# Patient Record
Sex: Female | Born: 1965 | Race: Black or African American | Hispanic: No | Marital: Single | Smoking: Current every day smoker
Health system: Southern US, Community
[De-identification: ages and names within clinical notes are randomized; demographics above are authoritative.]

## PROBLEM LIST (undated history)

## (undated) DIAGNOSIS — Z87440 Personal history of urinary (tract) infections: Secondary | ICD-10-CM

## (undated) HISTORY — PX: OTHER SURGICAL HISTORY: SHX169

## (undated) HISTORY — DX: Personal history of urinary (tract) infections: Z87.440

---

## 2017-10-06 ENCOUNTER — Other Ambulatory Visit: Payer: Self-pay | Admitting: Geriatric Medicine

## 2017-10-06 DIAGNOSIS — Z1231 Encounter for screening mammogram for malignant neoplasm of breast: Secondary | ICD-10-CM

## 2017-11-04 ENCOUNTER — Ambulatory Visit: Payer: Self-pay

## 2018-08-11 DIAGNOSIS — R7303 Prediabetes: Secondary | ICD-10-CM | POA: Insufficient documentation

## 2018-12-14 ENCOUNTER — Encounter: Payer: Self-pay | Admitting: Advanced Practice Midwife

## 2018-12-14 ENCOUNTER — Other Ambulatory Visit: Payer: Self-pay

## 2018-12-14 ENCOUNTER — Ambulatory Visit (LOCAL_COMMUNITY_HEALTH_CENTER): Payer: BC Managed Care – PPO | Admitting: Advanced Practice Midwife

## 2018-12-14 VITALS — BP 92/53 | Ht 65.0 in | Wt 122.0 lb

## 2018-12-14 DIAGNOSIS — Z30013 Encounter for initial prescription of injectable contraceptive: Secondary | ICD-10-CM | POA: Diagnosis not present

## 2018-12-14 DIAGNOSIS — R7303 Prediabetes: Secondary | ICD-10-CM

## 2018-12-14 DIAGNOSIS — Z3009 Encounter for other general counseling and advice on contraception: Secondary | ICD-10-CM

## 2018-12-14 DIAGNOSIS — Z3042 Encounter for surveillance of injectable contraceptive: Secondary | ICD-10-CM

## 2018-12-14 LAB — PREGNANCY, URINE: Preg Test, Ur: NEGATIVE

## 2018-12-14 MED ORDER — MEDROXYPROGESTERONE ACETATE 150 MG/ML IM SUSP
150.0000 mg | Freq: Once | INTRAMUSCULAR | Status: AC
  Administered 2018-12-14: 16:00:00 150 mg via INTRAMUSCULAR

## 2018-12-14 NOTE — Progress Notes (Signed)
UPT negative today. Pt received Depo 150mg IM today per provider order and pt tolerated well. Counseled pt per provider orders and pt states understanding. Provider orders completed.Treyten Monestime, RN 

## 2018-12-14 NOTE — Progress Notes (Signed)
   [place] problem visit  [place] Department  Subjective:  Katie Ramos is a 53 y.o. being seen today for DMPA reinitiation  Chief Complaint  Patient presents with  . Contraception    Depo    HPI  Last DMPA 08/11/2018 (17 6/7). Last sex 12/03/18 with condom.  LMP 11/19/18.  No documented PE at [facility]. Does the patient have a current or past history of drug use? No   No components found for: HCV]   Health Maintenance Due  Topic Date Due  . HIV Screening  11/30/1981  . TETANUS/TDAP  11/30/1985  . PAP SMEAR-Modifier  12/01/1987  . MAMMOGRAM  11/30/2016  . COLONOSCOPY  11/30/2016  . INFLUENZA VACCINE  09/30/2018    ROS  The following portions of the patient's history were reviewed and updated as appropriate: allergies, current medications, past family history, past medical history, past social history, past surgical history and problem list. Problem list updated.   See flowsheet for other program required questions.  Objective:   Vitals:   12/14/18 1414  BP: (!) 92/53  Weight: 122 lb (55.3 kg)  Height: 5\' 5"  (1.651 m)    Physical Exam  Appears emaciated  Assessment and Plan:  Katie Ramos is a 53 y.o. female presenting to the [facility] for a Women's Health problem visit  1. Family planning Late for DMPA  2. Encounter for surveillance of injectable contraceptive If PT neg today may have DMPA 150 mg IM x1 Needs physical Please counsel on need for abstinance/back up condoms next 7 days - Pregnancy, urine     Return for 11-13 wk DMPA.  No future appointments.  Herbie Saxon, CNM

## 2018-12-14 NOTE — Progress Notes (Signed)
Pt here for Depo restart. Last Depo was 08/11/2018, so pt is 17 weeks and 6 days post last Depo.Ronny Bacon, RN

## 2019-03-26 ENCOUNTER — Encounter: Payer: Self-pay | Admitting: Emergency Medicine

## 2019-03-26 ENCOUNTER — Ambulatory Visit
Admission: EM | Admit: 2019-03-26 | Discharge: 2019-03-26 | Disposition: A | Discharge status: Home / Self Care | Payer: BC Managed Care – PPO

## 2019-03-26 ENCOUNTER — Other Ambulatory Visit: Payer: Self-pay

## 2019-03-26 DIAGNOSIS — R1030 Lower abdominal pain, unspecified: Secondary | ICD-10-CM | POA: Diagnosis not present

## 2019-03-26 DIAGNOSIS — R3129 Other microscopic hematuria: Secondary | ICD-10-CM | POA: Diagnosis not present

## 2019-03-26 DIAGNOSIS — R197 Diarrhea, unspecified: Secondary | ICD-10-CM | POA: Diagnosis not present

## 2019-03-26 LAB — POCT URINALYSIS DIP (MANUAL ENTRY)
Bilirubin, UA: NEGATIVE
Glucose, UA: NEGATIVE mg/dL
Ketones, POC UA: NEGATIVE mg/dL
Leukocytes, UA: NEGATIVE
Nitrite, UA: NEGATIVE
Protein Ur, POC: NEGATIVE mg/dL
Spec Grav, UA: >=1.03 — AB (ref 1.010–1.025)
Urobilinogen, UA: 0.2 E.U./dL
pH, UA: 6 (ref 5.0–8.0)

## 2019-03-26 LAB — POCT URINE PREGNANCY: Preg Test, Ur: NEGATIVE

## 2019-03-26 MED ORDER — ONDANSETRON HCL 4 MG PO TABS: 4.0000 mg | ORAL_TABLET | Freq: Four times a day (QID) | ORAL | 0 refills | Status: AC

## 2019-03-26 NOTE — ED Provider Notes (Signed)
[facility]    CSN: [id number] Arrival date & time: 03/26/19  1458      History   Chief Complaint Chief Complaint  Patient presents with  . Diarrhea    HPI Katie Ramos is a 54 y.o. female presenting for lower abdominal pain/cramping, diarrhea.  States this is been ongoing for last 5 days.  Was seen via televisit the other day during which she was given Prilosec and simethicone.  Patient states she is taking these daily with moderate relief of symptoms.  Patient states that she was feeling better last night so she ate cabbage, fish, and mac & cheese which increased her symptoms.  Patient denies painful bowel movements or stool with mucus/blood/melena.  No fever, change in urination.  States she is currently having 4 bowel movements daily and that some more small and others large volume.  Reports good oral intake without vomiting.  Does have some nausea occasionally, though patient is unable to articulate exacerbating factors.  Patient denies known sick contacts, lightheadedness, weakness, chest pain, palpitations, dyspnea or myalgias.   History reviewed. No pertinent past medical history.  Patient Active Problem List   Diagnosis Date Noted  . Pre-diabetes 08/11/2018    History reviewed. No pertinent surgical history.  OB History   No obstetric history on file.      Home Medications    Prior to Admission medications   Medication Sig Start Date End Date Taking? Authorizing Provider  omeprazole (PRILOSEC) 10 MG capsule Take 10 mg by mouth daily.   Yes [provider]  simethicone (MYLICON) 80 MG chewable tablet Chew 80 mg by mouth every 6 (six) hours as needed for flatulence.   Yes [provider]  ondansetron (ZOFRAN) 4 MG tablet Take 1 tablet (4 mg total) by mouth every 6 (six) hours. 03/26/19   Hall-Potvin, [place], PA-C    Family History Family History  Problem Relation Age of Onset  . Healthy Mother   . Healthy Father     Social  History Social History   Tobacco Use  . Smoking status: Current Every Day Smoker    Packs/day: 0.20  . Smokeless tobacco: Never Used  Substance Use Topics  . Alcohol use: Not Currently  . Drug use: Never     Allergies   Patient has no known allergies.   Review of Systems As per HPI   Physical Exam Triage Vital Signs ED Triage Vitals  Enc Vitals Group     BP      Pulse      Resp      Temp      Temp src      SpO2      Weight      Height      Head Circumference      Peak Flow      Pain Score      Pain Loc      Pain Edu?      Excl. in [place]?    No data found.  Updated Vital Signs BP 114/74 (BP Location: Right Arm)   Pulse 73   Temp 98.6 F (37 C) (Oral)   Resp 16   LMP 03/12/2019   SpO2 98%   Visual Acuity Right Eye Distance:   Left Eye Distance:   Bilateral Distance:    Right Eye Near:   Left Eye Near:    Bilateral Near:     Physical Exam Constitutional:      General: She is not  in acute distress.    Appearance: She is normal weight. She is not ill-appearing or diaphoretic.  HENT:     Head: Normocephalic and atraumatic.     Nose: Nose normal.     Mouth/Throat:     Mouth: Mucous membranes are moist.     Pharynx: Oropharynx is clear.  Eyes:     General: No scleral icterus.    Conjunctiva/sclera: Conjunctivae normal.     Pupils: Pupils are equal, round, and reactive to light.  Cardiovascular:     Rate and Rhythm: Normal rate.  Pulmonary:     Effort: Pulmonary effort is normal. No respiratory distress.     Breath sounds: No wheezing, rhonchi or rales.  Abdominal:     General: Abdomen is flat. Bowel sounds are normal. There is no distension.     Palpations: Abdomen is soft. There is no mass.     Tenderness: There is abdominal tenderness. There is no right CVA tenderness, left CVA tenderness, guarding or rebound.     Hernia: No hernia is present.     Comments: Diffuse lower abdominal tenderness without rebound.  Negative Murphy's, McBurney's,  Rovsing signs.  Musculoskeletal:        General: Normal range of motion.     Cervical back: Neck supple. No tenderness.     Right lower leg: No edema.     Left lower leg: No edema.  Lymphadenopathy:     Cervical: No cervical adenopathy.  Skin:    Capillary Refill: Capillary refill takes less than 2 seconds.     Coloration: Skin is not jaundiced or pale.  Neurological:     General: No focal deficit present.     Mental Status: She is alert and oriented to person, place, and time.      UC Treatments / Results  Labs (all labs ordered are listed, but only abnormal results are displayed) Labs Reviewed  POCT URINALYSIS DIP (MANUAL ENTRY) - Abnormal; Notable for the following components:      Result Value   Spec Grav, UA >=1.030 (*)    Blood, UA moderate (*)    All other components within normal limits  POCT URINE PREGNANCY - Normal    EKG   Radiology No results found.  Procedures Procedures (including critical care time)  Medications Ordered in UC Medications - No data to display  Initial Impression / Assessment and Plan / UC Course  I have reviewed the triage vital signs and the nursing notes.  Pertinent labs & imaging results that were available during my care of the patient were reviewed by me and considered in my medical decision making (see chart for details).    I have reviewed the triage vital signs and the nursing notes.  All pertinent labs & imaging results that were available during my care of the patient were reviewed by me and considered in my medical decision making (see chart for details).  Patient afebrile, nontoxic in office.  Patient has diffuse lower abdominal pain likely second to prolonged bowel upset/diarrhea.  Patient has good oral intake without net negative output: We will focus on nausea control, hydration, diarrhea friendly diet.  Patient to follow-up with PCP for persistent, worsening symptoms as this may require further evaluation.  Also  stressed importance of repeat evaluation for hematuria noted today that is without urinary symptoms-patient is unsure if this is chronic for her.  Return precautions discussed, patient verbalized understanding and is agreeable to plan. Final Clinical Impressions(s) / UC Diagnoses  Final diagnoses:  Other microscopic hematuria  Diarrhea, unspecified type  Lower abdominal pain     Discharge Instructions     Practice diet as outlined in info packet x 3-5 day. Continue current medications. May take zofran for nausea. Important to establish care w/ PCP to monitor blood in urine.    ED Prescriptions    Medication Sig Dispense Auth. Provider   ondansetron (ZOFRAN) 4 MG tablet Take 1 tablet (4 mg total) by mouth every 6 (six) hours. 12 tablet Hall-Potvin, [place], PA-C     I have reviewed the PDMP during this encounter.   Neldon Mc [place], [place] 03/26/19 1831

## 2019-03-26 NOTE — ED Triage Notes (Signed)
Pt presents to [facility] for assessment of 5 days of diarrhea, abdominal cramping.  States every time she eats she has to have a bowel movement, and it was liquid in nature at first.  States now it has seemed to harden up.  Had a televisit, and has tried gas x and prilosec with mild relief.  Patient states she ate cabbage, fish, and mac and cheese which caused a severe increase in her cramping across the bottom of her abdomen.  Denies changes in urination.

## 2019-03-26 NOTE — Discharge Instructions (Signed)
Practice diet as outlined in info packet x 3-5 day. Continue current medications. May take zofran for nausea. Important to establish care w/ PCP to monitor blood in urine.

## 2019-05-21 ENCOUNTER — Ambulatory Visit (LOCAL_COMMUNITY_HEALTH_CENTER): Payer: BC Managed Care – PPO | Admitting: Physician Assistant

## 2019-05-21 ENCOUNTER — Other Ambulatory Visit: Payer: Self-pay

## 2019-05-21 VITALS — BP 97/61 | Ht 65.0 in | Wt 116.2 lb

## 2019-05-21 DIAGNOSIS — Z3009 Encounter for other general counseling and advice on contraception: Secondary | ICD-10-CM

## 2019-05-21 DIAGNOSIS — Z30013 Encounter for initial prescription of injectable contraceptive: Secondary | ICD-10-CM | POA: Diagnosis not present

## 2019-05-21 DIAGNOSIS — Z3042 Encounter for surveillance of injectable contraceptive: Secondary | ICD-10-CM

## 2019-05-21 MED ORDER — MEDROXYPROGESTERONE ACETATE 150 MG/ML IM SUSP
150.0000 mg | Freq: Once | INTRAMUSCULAR | Status: AC
  Administered 2019-05-21: 150 mg via INTRAMUSCULAR

## 2019-05-21 NOTE — Progress Notes (Signed)
In for visit to continue Depo; 22+ wks. Post last injection Sharlette Dense, RN

## 2019-05-22 NOTE — Progress Notes (Signed)
Family Planning Visit-  Subjective:  Katie Ramos is a 54 y.o. being seen today for a Depo.    She is currently using Depo-Provera injections for pregnancy prevention. Patient reports she does not  want a pregnancy in the next year. Patient  has Pre-diabetes on their problem list.  No chief complaint on file.   Patient reports that she is doing well with the Depo and desires to continue with this as BCM.  Patient states she had PE and pap January 2020, with PCP.  States that she smokes 1ppd of cigarettes.  Last sex was about 2 months ago per patient.  Patient denies any chronic conditions, regular medications and surgeries.   Does the patient desire a pregnancy in the next year? (OKQ flowsheet)  See flowsheet for other program required questions.   Body mass index is 19.34 kg/m. - Patient is eligible for diabetes screening based on BMI and age >53?  not applicable GD9M ordered? not applicable  Patient reports 1 of partners in last year. Desires STI screening?  No - .  Does the patient have a current or past history of drug use? No   No components found for: HCV]   Health Maintenance Due  Topic Date Due  . HIV Screening  Never done  . TETANUS/TDAP  Never done  . PAP SMEAR-Modifier  Never done  . MAMMOGRAM  Never done  . COLONOSCOPY  Never done  . INFLUENZA VACCINE  Never done    Review of Systems  All other systems reviewed and are negative.   The following portions of the patient's history were reviewed and updated as appropriate: allergies, current medications, past family history, past medical history, past social history, past surgical history and problem list. Problem list updated.  Objective:   Vitals:   05/21/19 1554  BP: 97/61  Weight: 116 lb 3.2 oz (52.7 kg)  Height: 5\' 5"  (1.651 m)    Physical Exam Vitals and nursing note reviewed.  Constitutional:      General: She is not in acute distress.    Appearance: Normal appearance. She is normal weight.   HENT:     Head: Normocephalic and atraumatic.  Pulmonary:     Effort: Pulmonary effort is normal.  Neurological:     Mental Status: She is alert and oriented to person, place, and time.  Psychiatric:        Mood and Affect: Mood normal.        Behavior: Behavior normal.        Thought Content: Thought content normal.        Judgment: Judgment normal.       Assessment and Plan:  Katie Ramos is a 54 y.o. female presenting to the [facility] Department for a family planning visit  Contraception counseling: Reviewed all forms of birth control options in the tiered based approach. available including abstinence; over the counter/barrier methods; hormonal contraceptive medication including pill, patch, ring, injection,contraceptive implant; hormonal and nonhormonal IUDs; permanent sterilization options including vasectomy and the various tubal sterilization modalities. Risks, benefits, and typical effectiveness rates were reviewed.  Questions were answered.  Written information was also given to the patient to review.  Patient desires to continue with Depo, this was prescribed for patient. She will follow up in  3 months and prn for surveillance.  She was told to call with any further questions, or with any concerns about this method of contraception.  Emphasized use of condoms 100% of the time for STI  prevention.  Patient was not a candidate for ECP today.    1. Encounter for counseling regarding contraception Counseled patient re:  Due to age and possibility of being post menopausal, that she needs to discuss continuing Depo with PCP.  Counseled that she would need letter from PCP that she has had this discussion and maybe have had hormone levels showing whether she has gone through menopause and bring the letter and copy of PE and last pap for [place] to continue to give her Depo. Patient stated that she thinks she did have hormone testing done and that is why she is still on  Depo.  Patient agrees to bring info with her to next visit. Rec condoms with all sex for 2 weeks after shot today.  2. Surveillance for Depo-Provera contraception OK for Depo 150mg  IM today. - medroxyPROGESTERone (DEPO-PROVERA) injection 150 mg     No follow-ups on file.  No future appointments.  , PA

## 2020-08-15 ENCOUNTER — Other Ambulatory Visit: Payer: Self-pay | Admitting: Physician Assistant

## 2020-08-15 ENCOUNTER — Other Ambulatory Visit: Payer: Self-pay | Admitting: Obstetrics and Gynecology

## 2020-08-15 ENCOUNTER — Ambulatory Visit
Admission: RE | Admit: 2020-08-15 | Discharge: 2020-08-15 | Disposition: A | Discharge status: Home / Self Care | Payer: BC Managed Care – PPO | Source: Ambulatory Visit | Attending: Physician Assistant | Admitting: Physician Assistant

## 2020-08-15 ENCOUNTER — Other Ambulatory Visit: Payer: Self-pay

## 2020-08-15 DIAGNOSIS — Z1231 Encounter for screening mammogram for malignant neoplasm of breast: Secondary | ICD-10-CM

## 2020-08-19 ENCOUNTER — Other Ambulatory Visit: Payer: Self-pay | Admitting: Obstetrics and Gynecology

## 2020-08-19 DIAGNOSIS — R928 Other abnormal and inconclusive findings on diagnostic imaging of breast: Secondary | ICD-10-CM

## 2020-09-12 ENCOUNTER — Other Ambulatory Visit: Payer: Self-pay

## 2020-09-12 ENCOUNTER — Ambulatory Visit
Admission: RE | Admit: 2020-09-12 | Discharge: 2020-09-12 | Disposition: A | Discharge status: Home / Self Care | Payer: BC Managed Care – PPO | Source: Ambulatory Visit | Attending: Obstetrics and Gynecology | Admitting: Obstetrics and Gynecology

## 2020-09-12 ENCOUNTER — Other Ambulatory Visit: Payer: Self-pay | Admitting: Obstetrics and Gynecology

## 2020-09-12 DIAGNOSIS — R928 Other abnormal and inconclusive findings on diagnostic imaging of breast: Secondary | ICD-10-CM

## 2020-09-19 ENCOUNTER — Other Ambulatory Visit: Payer: BC Managed Care – PPO

## 2020-09-26 ENCOUNTER — Other Ambulatory Visit: Payer: Self-pay | Admitting: Obstetrics and Gynecology

## 2020-09-26 ENCOUNTER — Ambulatory Visit
Admission: RE | Admit: 2020-09-26 | Discharge: 2020-09-26 | Disposition: A | Discharge status: Home / Self Care | Payer: BC Managed Care – PPO | Source: Ambulatory Visit | Attending: Obstetrics and Gynecology | Admitting: Obstetrics and Gynecology

## 2020-09-26 ENCOUNTER — Other Ambulatory Visit: Payer: Self-pay

## 2020-09-26 DIAGNOSIS — R928 Other abnormal and inconclusive findings on diagnostic imaging of breast: Secondary | ICD-10-CM

## 2020-09-26 DIAGNOSIS — N63 Unspecified lump in unspecified breast: Secondary | ICD-10-CM

## 2020-09-29 ENCOUNTER — Other Ambulatory Visit: Payer: BC Managed Care – PPO

## 2020-12-12 DIAGNOSIS — E7800 Pure hypercholesterolemia, unspecified: Secondary | ICD-10-CM | POA: Diagnosis not present

## 2020-12-12 DIAGNOSIS — R5383 Other fatigue: Secondary | ICD-10-CM | POA: Diagnosis not present

## 2020-12-12 DIAGNOSIS — R350 Frequency of micturition: Secondary | ICD-10-CM | POA: Diagnosis not present

## 2020-12-12 DIAGNOSIS — Z131 Encounter for screening for diabetes mellitus: Secondary | ICD-10-CM | POA: Diagnosis not present

## 2020-12-12 DIAGNOSIS — R030 Elevated blood-pressure reading, without diagnosis of hypertension: Secondary | ICD-10-CM | POA: Diagnosis not present

## 2020-12-12 DIAGNOSIS — Z87891 Personal history of nicotine dependence: Secondary | ICD-10-CM | POA: Diagnosis not present

## 2020-12-12 DIAGNOSIS — Z681 Body mass index (BMI) 19 or less, adult: Secondary | ICD-10-CM | POA: Diagnosis not present

## 2020-12-12 DIAGNOSIS — Z79899 Other long term (current) drug therapy: Secondary | ICD-10-CM | POA: Diagnosis not present

## 2020-12-12 DIAGNOSIS — Z1159 Encounter for screening for other viral diseases: Secondary | ICD-10-CM | POA: Diagnosis not present

## 2020-12-12 DIAGNOSIS — E559 Vitamin D deficiency, unspecified: Secondary | ICD-10-CM | POA: Diagnosis not present

## 2020-12-26 ENCOUNTER — Ambulatory Visit: Payer: BC Managed Care – PPO

## 2021-01-01 ENCOUNTER — Encounter: Payer: Self-pay | Admitting: Physician Assistant

## 2021-01-01 ENCOUNTER — Other Ambulatory Visit: Payer: Self-pay

## 2021-01-01 ENCOUNTER — Ambulatory Visit (LOCAL_COMMUNITY_HEALTH_CENTER): Payer: BC Managed Care – PPO | Admitting: Physician Assistant

## 2021-01-01 VITALS — BP 102/63 | HR 98 | Ht 64.0 in | Wt 113.8 lb

## 2021-01-01 DIAGNOSIS — N951 Menopausal and female climacteric states: Secondary | ICD-10-CM

## 2021-01-01 DIAGNOSIS — Z3009 Encounter for other general counseling and advice on contraception: Secondary | ICD-10-CM

## 2021-01-01 NOTE — Progress Notes (Signed)
Normal menses usually lasts 5 days and LNMP 08/2020. Did not have a menses in August. LMP was 11/21/2020. Lasted 2 days and was very light. Jossie Ng, RN

## 2021-01-01 NOTE — Progress Notes (Signed)
55 yo woman presents well-woman exam and DMPA. At last [facility] visit 05/21/19, pt was counseled re:  due to age and possibility of being post menopausal, that she needed to discuss continuing Depo with PCP.  Counseled that she would need letter from PCP that she has had this discussion and maybe have had hormone levels showing whether she has gone through menopause and bring the letter and copy of PE and last pap for [place] to continue to give her Depo. Patient stated that she thinks she did have hormone testing done and that is why she is still on Depo.  Patient agrees to bring info with her to next visit. Pt had PE with [facility] 01/04/20 and reports having DMPA that day and again in Jan 2022, none since. Wants DMPA as it makes her menses more manageable. Uses condoms consistently. Last sex 3 wk ago, with irreg menses last several months. In light of probable perimenopausal status based on age now 39 and irreg menses decreasing in frequency, pt counseled she is no longer eligible to receive family planning services for contraception at [facility]. She was offered STI screening and immunizations today, which she declined. I apologized that the visit appointment was made despite her ineligibility for the physical/DMPA, and she graciously accepted my apology. She indicated she would seek ongoing well woman care with her PCP.

## 2021-01-30 DIAGNOSIS — Z01419 Encounter for gynecological examination (general) (routine) without abnormal findings: Secondary | ICD-10-CM | POA: Diagnosis not present

## 2021-03-10 DIAGNOSIS — M542 Cervicalgia: Secondary | ICD-10-CM | POA: Diagnosis not present

## 2021-03-10 DIAGNOSIS — M79641 Pain in right hand: Secondary | ICD-10-CM | POA: Diagnosis not present

## 2021-04-03 ENCOUNTER — Ambulatory Visit
Admission: RE | Admit: 2021-04-03 | Discharge: 2021-04-03 | Disposition: A | Discharge status: Home / Self Care | Payer: BC Managed Care – PPO | Source: Ambulatory Visit | Attending: Obstetrics and Gynecology | Admitting: Obstetrics and Gynecology

## 2021-04-03 ENCOUNTER — Other Ambulatory Visit: Payer: Self-pay | Admitting: Obstetrics and Gynecology

## 2021-04-03 DIAGNOSIS — N63 Unspecified lump in unspecified breast: Secondary | ICD-10-CM

## 2021-04-03 DIAGNOSIS — N6313 Unspecified lump in the right breast, lower outer quadrant: Secondary | ICD-10-CM | POA: Diagnosis not present

## 2021-07-31 DIAGNOSIS — R040 Epistaxis: Secondary | ICD-10-CM | POA: Diagnosis not present

## 2021-08-20 ENCOUNTER — Other Ambulatory Visit: Payer: BC Managed Care – PPO

## 2021-08-21 ENCOUNTER — Other Ambulatory Visit: Payer: BC Managed Care – PPO

## 2022-03-12 DIAGNOSIS — F418 Other specified anxiety disorders: Secondary | ICD-10-CM | POA: Diagnosis not present

## 2022-03-12 DIAGNOSIS — R634 Abnormal weight loss: Secondary | ICD-10-CM | POA: Diagnosis not present

## 2022-03-12 DIAGNOSIS — J302 Other seasonal allergic rhinitis: Secondary | ICD-10-CM | POA: Diagnosis not present

## 2022-03-12 DIAGNOSIS — R040 Epistaxis: Secondary | ICD-10-CM | POA: Diagnosis not present

## 2022-04-16 DIAGNOSIS — Z131 Encounter for screening for diabetes mellitus: Secondary | ICD-10-CM | POA: Diagnosis not present

## 2022-04-16 DIAGNOSIS — E559 Vitamin D deficiency, unspecified: Secondary | ICD-10-CM | POA: Diagnosis not present

## 2022-04-16 DIAGNOSIS — Z136 Encounter for screening for cardiovascular disorders: Secondary | ICD-10-CM | POA: Diagnosis not present

## 2022-04-16 DIAGNOSIS — Z0001 Encounter for general adult medical examination with abnormal findings: Secondary | ICD-10-CM | POA: Diagnosis not present

## 2022-04-20 DIAGNOSIS — R748 Abnormal levels of other serum enzymes: Secondary | ICD-10-CM | POA: Diagnosis not present

## 2022-04-27 ENCOUNTER — Other Ambulatory Visit: Payer: Self-pay | Admitting: Family Medicine

## 2022-04-27 DIAGNOSIS — R748 Abnormal levels of other serum enzymes: Secondary | ICD-10-CM

## 2022-05-12 ENCOUNTER — Other Ambulatory Visit: Payer: Self-pay | Admitting: Obstetrics and Gynecology

## 2022-05-12 DIAGNOSIS — N63 Unspecified lump in unspecified breast: Secondary | ICD-10-CM

## 2022-05-21 DIAGNOSIS — R634 Abnormal weight loss: Secondary | ICD-10-CM | POA: Diagnosis not present

## 2022-05-21 DIAGNOSIS — Z1211 Encounter for screening for malignant neoplasm of colon: Secondary | ICD-10-CM | POA: Diagnosis not present

## 2022-05-21 DIAGNOSIS — R748 Abnormal levels of other serum enzymes: Secondary | ICD-10-CM | POA: Diagnosis not present

## 2022-05-21 DIAGNOSIS — F321 Major depressive disorder, single episode, moderate: Secondary | ICD-10-CM | POA: Diagnosis not present

## 2022-05-21 DIAGNOSIS — Z1239 Encounter for other screening for malignant neoplasm of breast: Secondary | ICD-10-CM | POA: Diagnosis not present

## 2022-05-21 DIAGNOSIS — F17210 Nicotine dependence, cigarettes, uncomplicated: Secondary | ICD-10-CM | POA: Diagnosis not present

## 2022-05-21 DIAGNOSIS — R7303 Prediabetes: Secondary | ICD-10-CM | POA: Diagnosis not present

## 2022-05-21 DIAGNOSIS — Z23 Encounter for immunization: Secondary | ICD-10-CM | POA: Diagnosis not present

## 2022-05-21 DIAGNOSIS — Z0001 Encounter for general adult medical examination with abnormal findings: Secondary | ICD-10-CM | POA: Diagnosis not present

## 2022-06-11 ENCOUNTER — Ambulatory Visit
Admission: RE | Admit: 2022-06-11 | Discharge: 2022-06-11 | Disposition: A | Discharge status: Home / Self Care | Payer: BC Managed Care – PPO | Source: Ambulatory Visit | Attending: Family Medicine | Admitting: Family Medicine

## 2022-06-11 DIAGNOSIS — R748 Abnormal levels of other serum enzymes: Secondary | ICD-10-CM

## 2022-07-02 ENCOUNTER — Ambulatory Visit
Admission: RE | Admit: 2022-07-02 | Discharge: 2022-07-02 | Disposition: A | Discharge status: Home / Self Care | Payer: BC Managed Care – PPO | Source: Ambulatory Visit | Attending: Obstetrics and Gynecology | Admitting: Obstetrics and Gynecology

## 2022-07-02 DIAGNOSIS — N63 Unspecified lump in unspecified breast: Secondary | ICD-10-CM

## 2022-07-02 DIAGNOSIS — N6310 Unspecified lump in the right breast, unspecified quadrant: Secondary | ICD-10-CM | POA: Diagnosis not present

## 2022-07-16 ENCOUNTER — Ambulatory Visit
Admission: RE | Admit: 2022-07-16 | Discharge: 2022-07-16 | Disposition: A | Discharge status: Home / Self Care | Payer: BC Managed Care – PPO | Source: Ambulatory Visit | Attending: Family Medicine | Admitting: Family Medicine

## 2022-07-16 DIAGNOSIS — R7401 Elevation of levels of liver transaminase levels: Secondary | ICD-10-CM | POA: Diagnosis not present

## 2023-01-17 ENCOUNTER — Other Ambulatory Visit: Payer: BC Managed Care – PPO

## 2023-04-05 IMAGING — MG MM DIGITAL SCREENING BILAT W/ TOMO AND CAD
8 series · 9 of 24 positions shown · non-contrast
Comparison: None.

CLINICAL DATA: Screening.

EXAM:
DIGITAL SCREENING BILATERAL MAMMOGRAM WITH TOMOSYNTHESIS AND CAD
TECHNIQUE: Bilateral screening digital craniocaudal and mediolateral oblique
mammograms were obtained. Bilateral screening digital breast
tomosynthesis was performed. The images were evaluated with
computer-aided detection.

[R CC synth-2D]
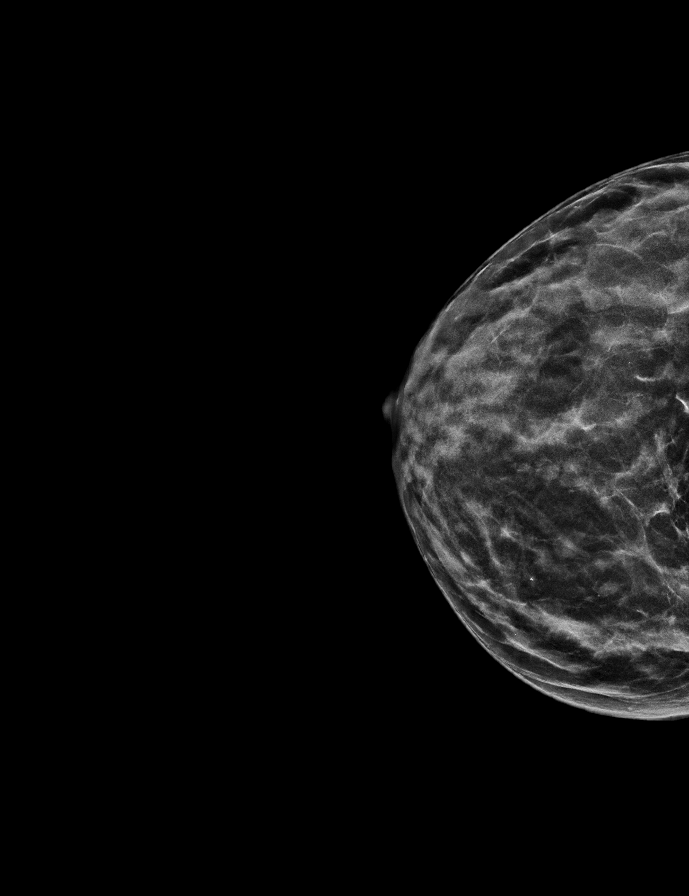

[R MLO synth-2D]
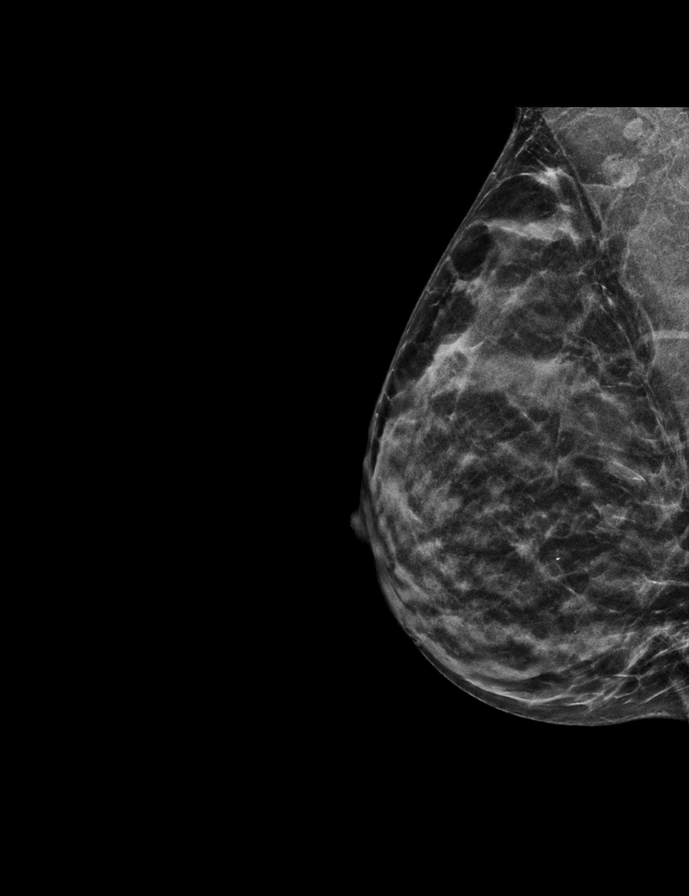

[L CC synth-2D]
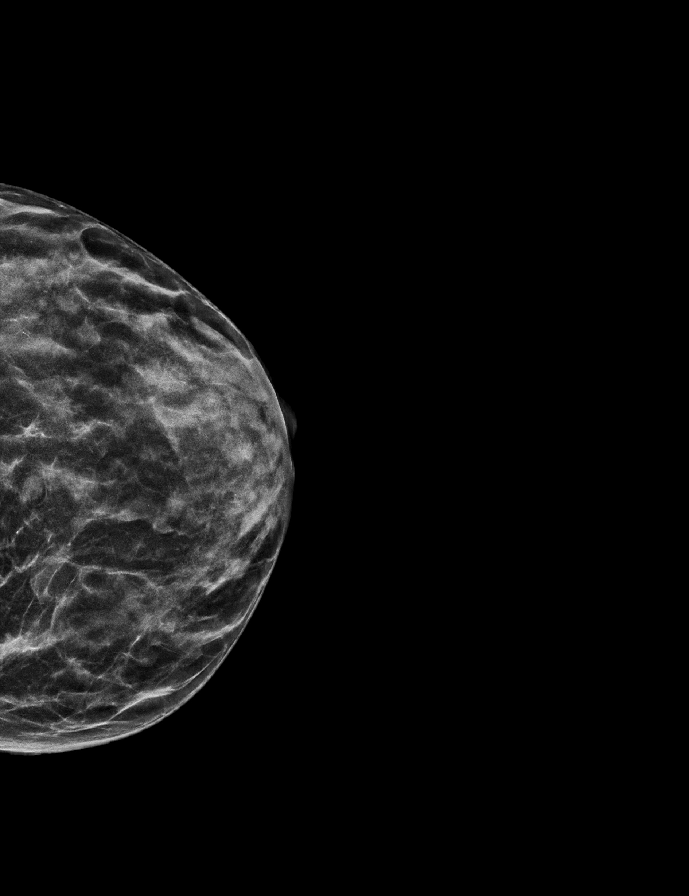

[L MLO synth-2D]
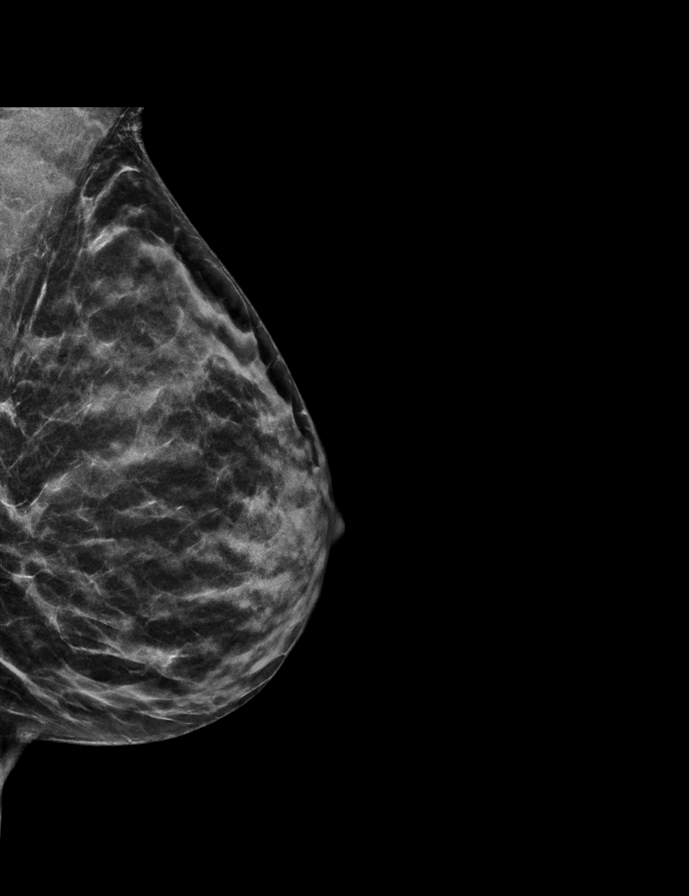

[R CC tomo · 2 of 42 frames shown]
[frame 14/42]
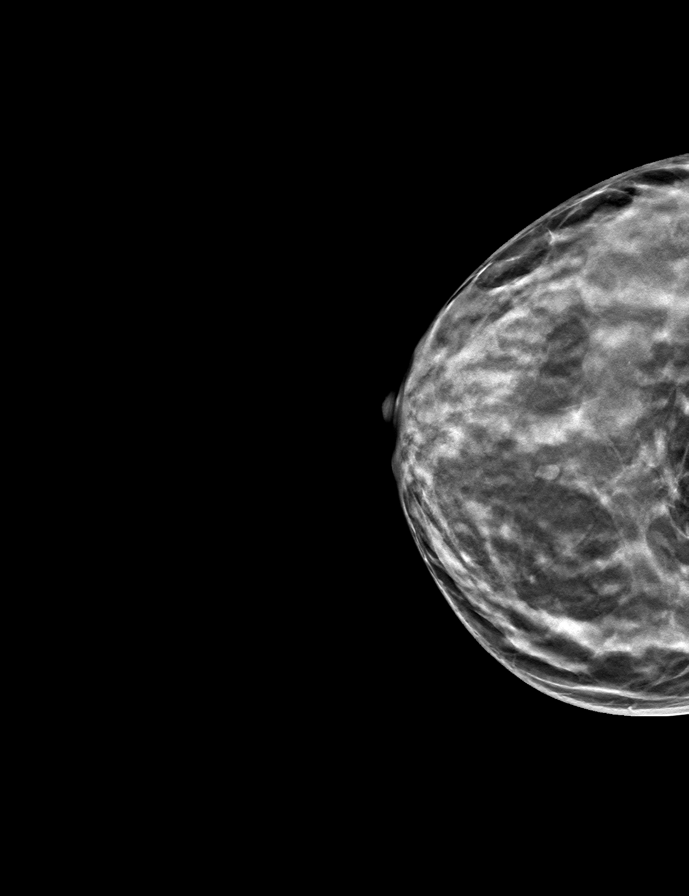
[frame 21/42]
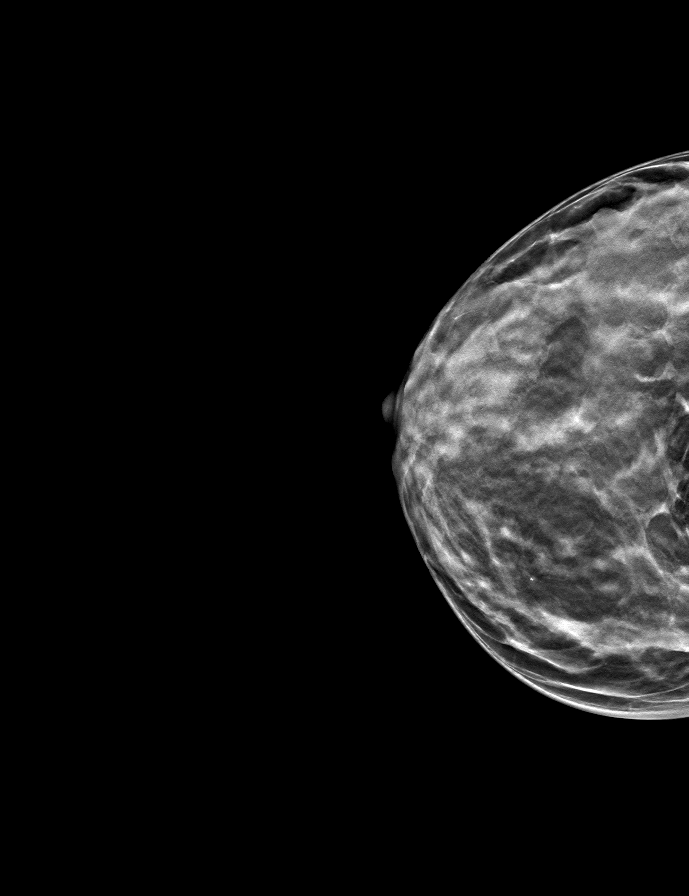

[L CC tomo · tomo slice 21/41.0]
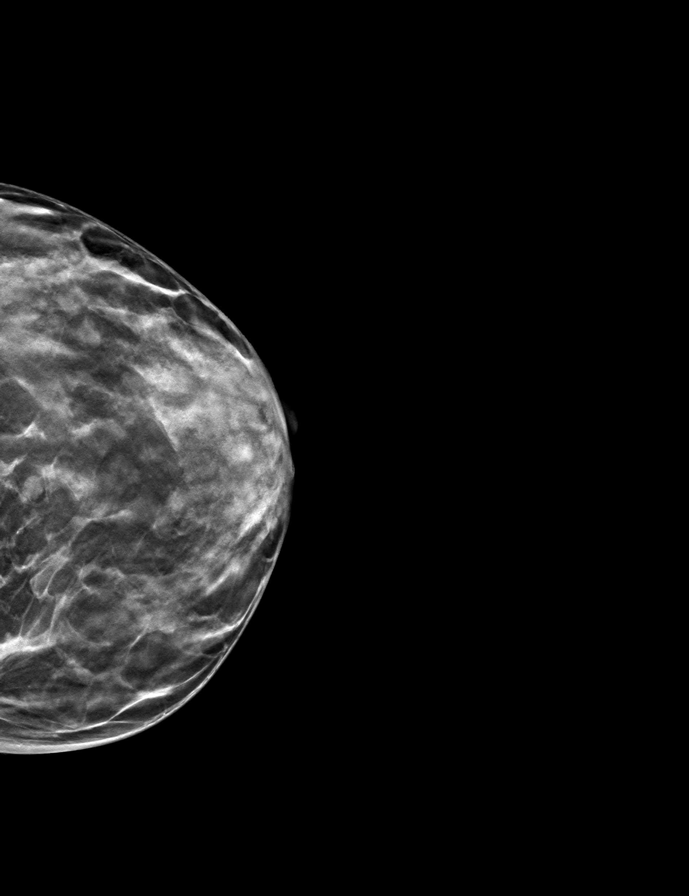

[R MLO tomo · tomo slice 21/41.0]
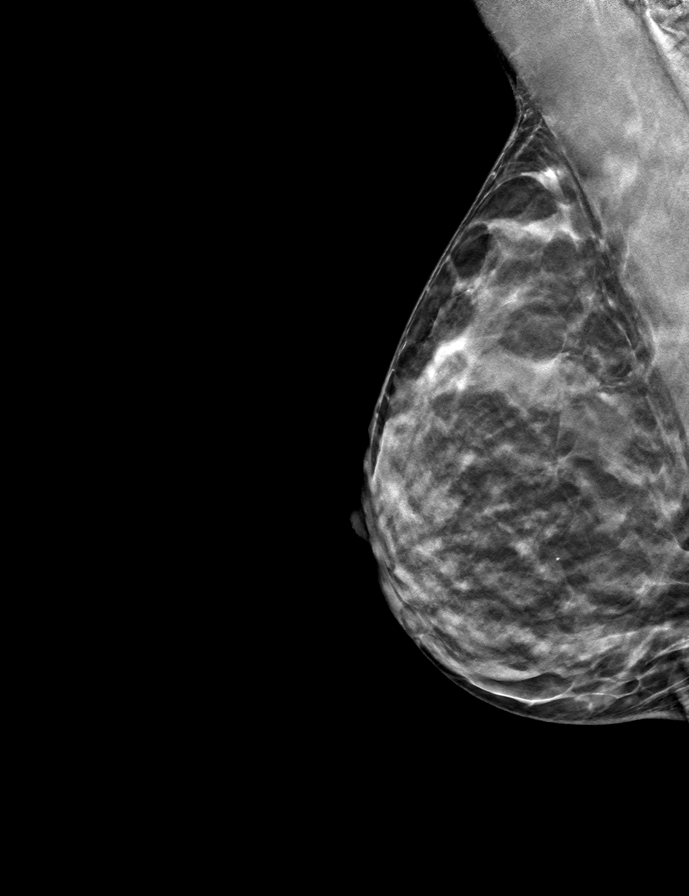

[L MLO tomo · tomo slice 21/41.0]
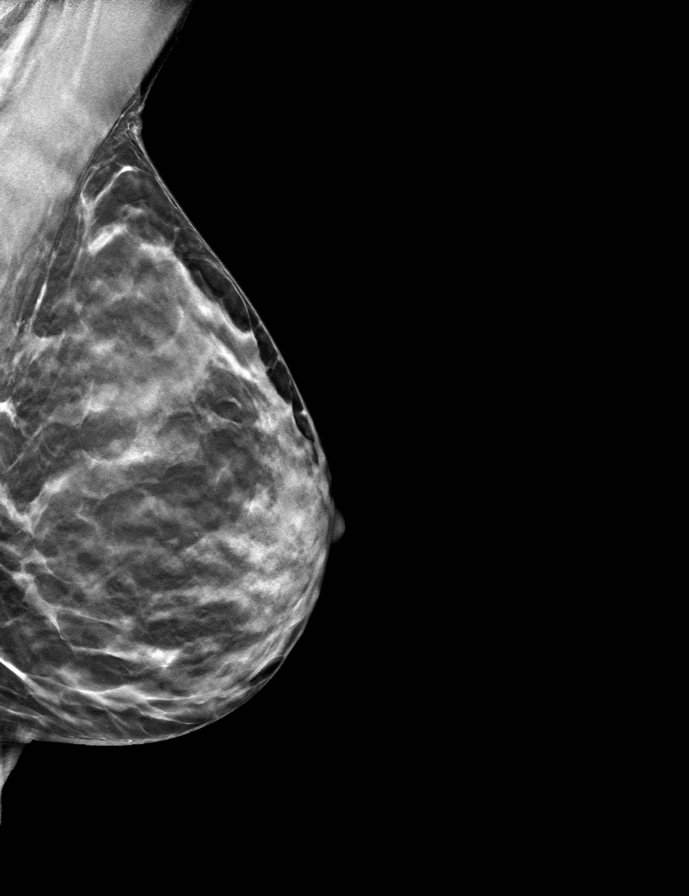

[9 of 24 positions shown; findings below may reference images not displayed]

ACR Breast Density Category c: The breast tissue is heterogeneously
dense, which may obscure small masses.
FINDINGS: In the right breast, a possible mass warrants further evaluation. In
the left breast, no findings suspicious for malignancy.
IMPRESSION: Further evaluation is suggested for a possible mass in the right
breast.

RECOMMENDATION:
Diagnostic mammogram and possibly ultrasound of the right breast.
(Code:VQ-[id number])

The patient will be contacted regarding the findings, and additional
imaging will be scheduled.

BI-RADS CATEGORY  0: Incomplete. Need additional imaging evaluation
and/or prior mammograms for comparison.

## 2023-04-15 ENCOUNTER — Other Ambulatory Visit: Payer: Self-pay | Admitting: Family Medicine

## 2023-04-15 DIAGNOSIS — R6889 Other general symptoms and signs: Secondary | ICD-10-CM

## 2023-11-22 IMAGING — US US BREAST*R* LIMITED INC AXILLA
1 series · 6 of 6 positions shown · non-contrast
Comparison: Previous exam(s).

CLINICAL DATA: 55-year-old female presenting for follow-up of a
likely benign right breast mass.

EXAM:
ULTRASOUND OF THE RIGHT BREAST

[Series 1: us breast*right* limited inc axilla · 0.04mm/px · 6 of 6 slices shown]
[im 1/6]
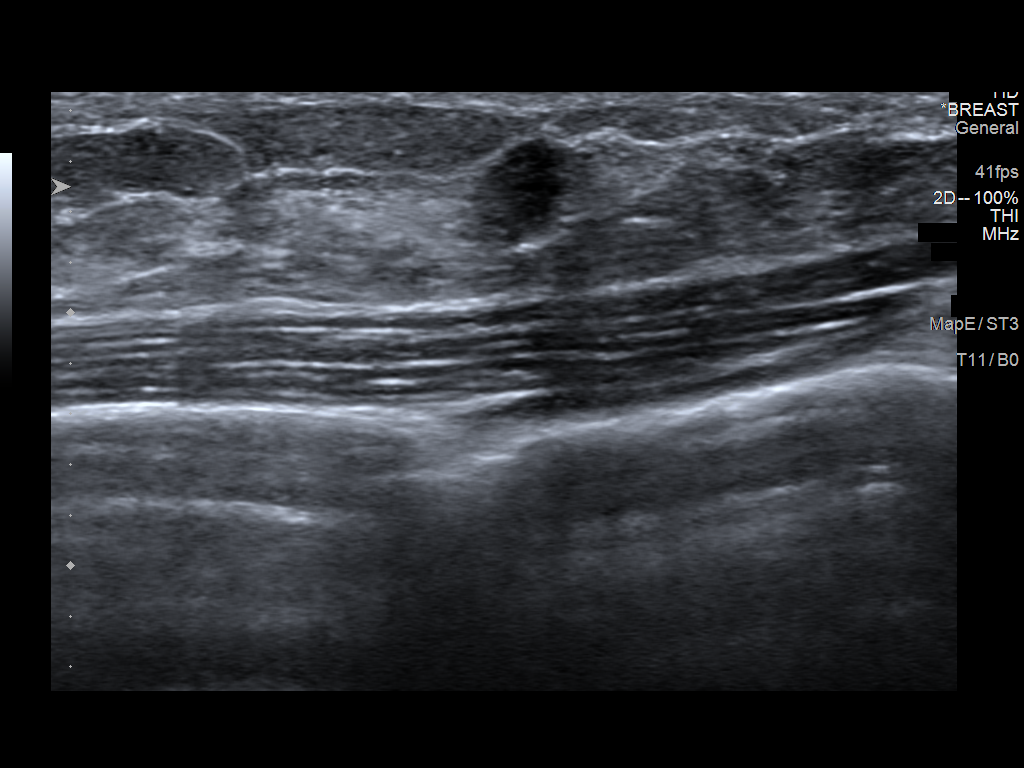
[im 2/6]
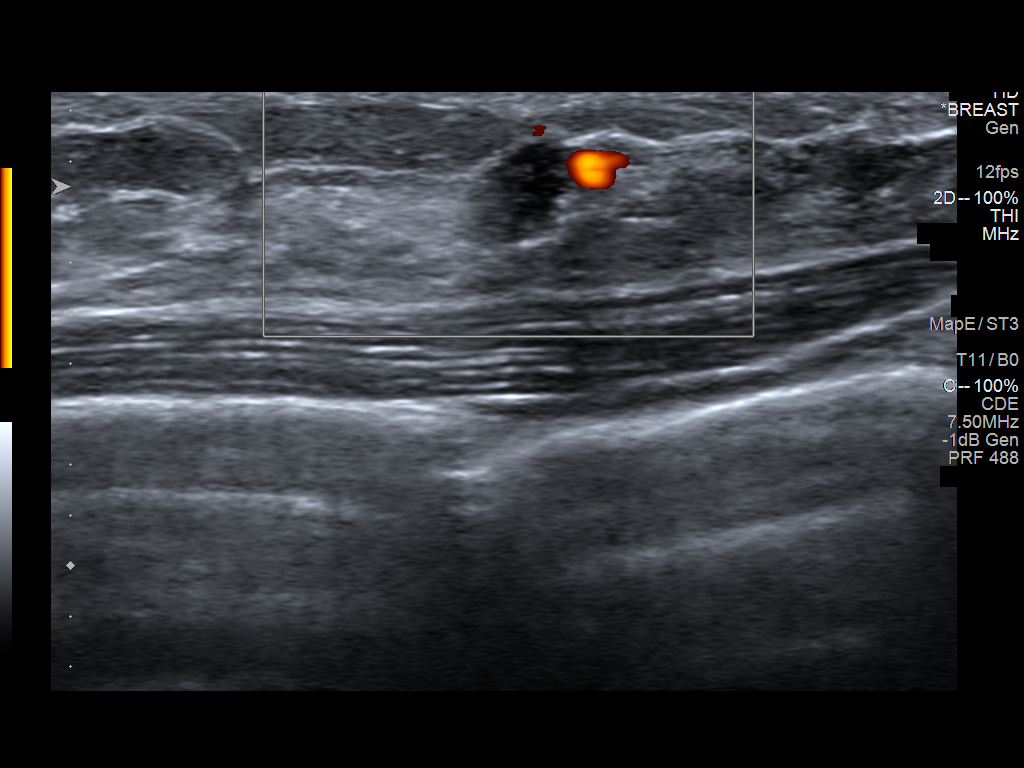
[im 3/6]
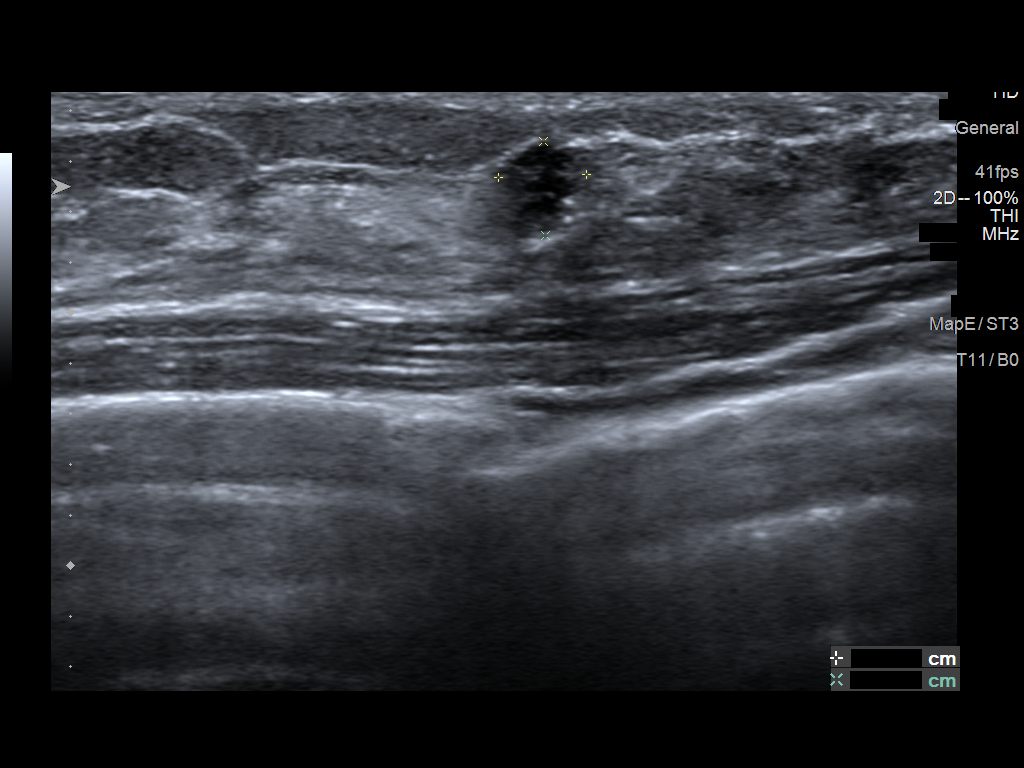
[im 4/6]
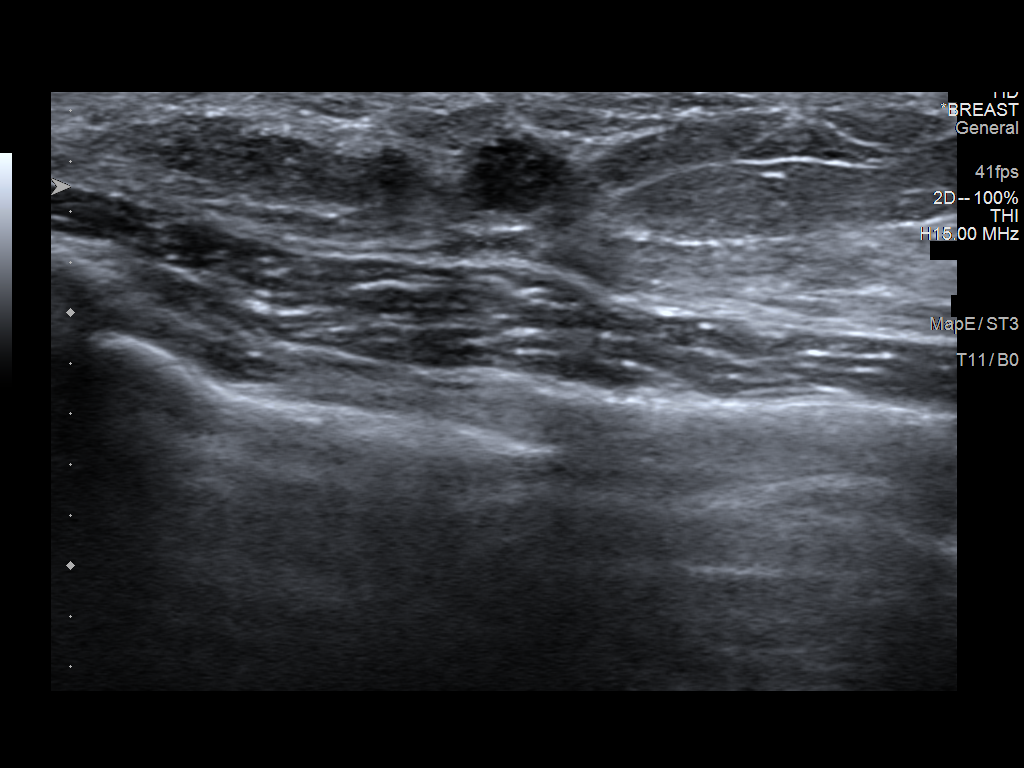
[im 5/6]
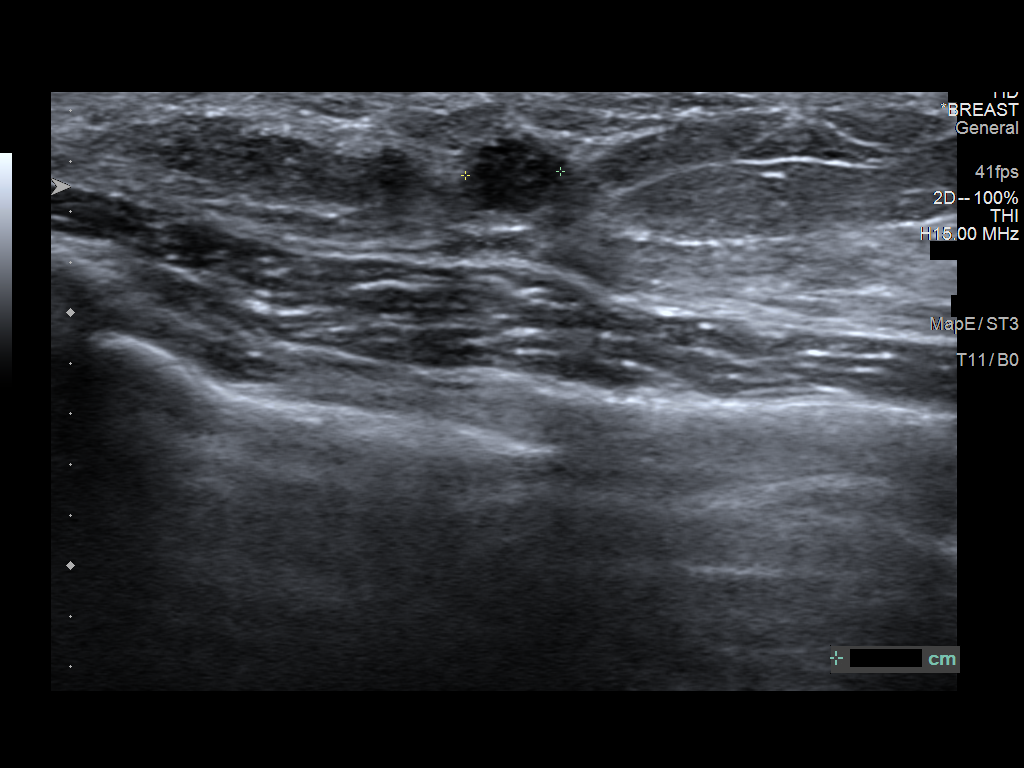
[im 6/6]
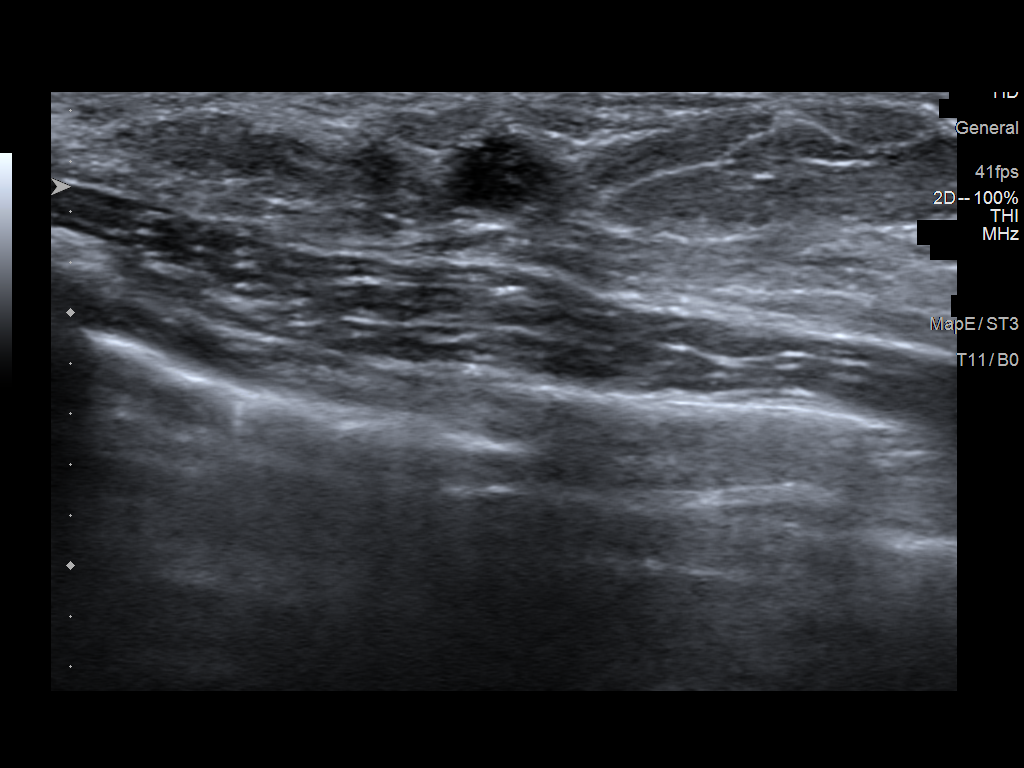

[6 of 6 positions shown; findings below may reference images not displayed]

FINDINGS: Ultrasound of the right breast at [DATE], 2 cm from the nipple
demonstrates a stable to slightly smaller hypoechoic mass measuring
4 x 4 x 4 mm, previously 7 x 5 x 5 mm.
IMPRESSION: Stable to slight decrease in size of the likely benign right breast
mass at [DATE].

RECOMMENDATION:
Six-month follow-up bilateral diagnostic mammogram and right breast
ultrasound.

I have discussed the findings and recommendations with the patient.
If applicable, a reminder letter will be sent to the patient
regarding the next appointment.

BI-RADS CATEGORY  3: Probably benign.

## 2023-12-16 ENCOUNTER — Ambulatory Visit: Admitting: Podiatry

## 2024-02-07 ENCOUNTER — Encounter: Payer: Self-pay | Admitting: Podiatry

## 2024-02-07 ENCOUNTER — Ambulatory Visit: Admitting: Podiatry

## 2024-02-07 ENCOUNTER — Ambulatory Visit

## 2024-02-07 VITALS — Ht 64.0 in | Wt 113.8 lb

## 2024-02-07 DIAGNOSIS — M722 Plantar fascial fibromatosis: Secondary | ICD-10-CM | POA: Diagnosis not present

## 2024-02-07 DIAGNOSIS — D2372 Other benign neoplasm of skin of left lower limb, including hip: Secondary | ICD-10-CM

## 2024-02-07 DIAGNOSIS — M7752 Other enthesopathy of left foot: Secondary | ICD-10-CM

## 2024-02-07 NOTE — Progress Notes (Signed)
   Chief Complaint  Patient presents with   Foot Pain    Pt is here due to left foot pain, states the foot has been bothering her for about 3 years of and on the pain is mostly at the heel of the foot, she states there is a sore spot on the heel that causes pain, states the heel also swells, she works at [employer] and is on her feet a lot while working.    Subjective: 58 y.o. female presenting to the office today for eval patient of left heel pain ongoing for several years.   Past Medical History:  Diagnosis Date   History of urinary tract infection     Past Surgical History:  Procedure Laterality Date   Denies surgical history      No Known Allergies   Objective:  Physical Exam General: Alert and oriented x3 in no acute distress  Dermatology: Hyperkeratotic lesion(s) present on the plantar aspect of the left heel. Pain on palpation with a central nucleated core noted. Skin is warm, dry and supple bilateral lower extremities. Negative for open lesions or macerations.  Vascular: Palpable pedal pulses bilaterally. No edema or erythema noted. Capillary refill within normal limits.  Neurological: Grossly intact via light touch  Musculoskeletal Exam: Pain on palpation at the keratotic lesion(s) noted. Range of motion within normal limits bilateral. Muscle strength 5/5 in all groups bilateral.  Assessment: 1.  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  - Cantharone applied with a bandaid.  Post care instructions provided - Refrain from work x 1 week -Return to clinic 3 weeks  *works at [employer] x 25 yrs here in [place]  Thresa EMERSON Sar, [facility]  Dr. Thresa EMERSON Sar, DPM    [address] [place], [place] [address]                Office [phone]  Fax [phone]

## 2024-02-13 ENCOUNTER — Telehealth: Payer: Self-pay | Admitting: Podiatry

## 2024-02-13 NOTE — Telephone Encounter (Signed)
 cld pt bk from mess lft.She adv Dr. Janit said he would waive 25 form fee. I adv her [place] form has not been recd as of yet. I faxed the work note to them [phone] and will email it to her as well as form when it it is completed.

## 2024-02-22 ENCOUNTER — Encounter: Payer: Self-pay | Admitting: Podiatry

## 2024-02-22 ENCOUNTER — Ambulatory Visit: Admitting: Podiatry

## 2024-02-22 VITALS — Ht 64.0 in | Wt 113.8 lb

## 2024-02-22 DIAGNOSIS — D2372 Other benign neoplasm of skin of left lower limb, including hip: Secondary | ICD-10-CM | POA: Diagnosis not present

## 2024-02-24 MED ORDER — TRAMADOL HCL 50 MG PO TABS: 50.0000 mg | ORAL_TABLET | Freq: Four times a day (QID) | ORAL | 0 refills | Status: AC | PRN

## 2024-02-24 NOTE — Progress Notes (Signed)
" ° °  Chief Complaint  Patient presents with   Foot Pain    Pt is here to f/u on left foot due to pain.    Subjective: 58 y.o. female presenting to the office today for eval patient of left heel pain ongoing for several years.  Overall there is some improvement however she continues to have some slight tenderness to the area.   Past Medical History:  Diagnosis Date   History of urinary tract infection     Past Surgical History:  Procedure Laterality Date   Denies surgical history      No Known Allergies   Objective:  Physical Exam General: Alert and oriented x3 in no acute distress  Dermatology: Improved however there continues to be some hyperkeratotic lesion(s) present on the plantar aspect of the left heel. Pain on palpation with a central nucleated core noted. Skin is warm, dry and supple bilateral lower extremities. Negative for open lesions or macerations.  Vascular: Palpable pedal pulses bilaterally. No edema or erythema noted. Capillary refill within normal limits.  Neurological: Grossly intact via light touch  Musculoskeletal Exam: Pain on palpation at the keratotic lesion(s) noted. Range of motion within normal limits bilateral. Muscle strength 5/5 in all groups bilateral.  Assessment: 1.  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  Excisional debridement of keratoic lesion(s) using a chisel blade was performed without incident.  - Cantharone was applied again today under occlusion with a Band-Aid -Return to clinic 4 weeks follow-up  *works at [employer] x 25 yrs here in [place]  Thresa EMERSON Sar, [facility]  Dr. Thresa EMERSON Sar, DPM    [address] [place], [place] [address]                Office [phone]  Fax [phone]     "

## 2024-02-28 ENCOUNTER — Ambulatory Visit: Admitting: Podiatry

## 2024-03-12 ENCOUNTER — Ambulatory Visit: Payer: Self-pay | Admitting: Podiatry
